# Patient Record
Sex: Male | Born: 1980 | Race: White | Hispanic: No | Marital: Single | Smoking: Former smoker
Health system: Southern US, Community
[De-identification: ages and names within clinical notes are randomized; demographics above are authoritative.]

---

## 2004-03-25 ENCOUNTER — Ambulatory Visit (HOSPITAL_COMMUNITY): Admission: RE | Admit: 2004-03-25 | Discharge: 2004-03-25 | Payer: Self-pay | Admitting: Family Medicine

## 2004-11-01 ENCOUNTER — Emergency Department (HOSPITAL_COMMUNITY): Admission: EM | Admit: 2004-11-01 | Discharge: 2004-11-01 | Payer: Self-pay | Admitting: Emergency Medicine

## 2011-11-16 ENCOUNTER — Ambulatory Visit
Admission: RE | Admit: 2011-11-16 | Discharge: 2011-11-16 | Disposition: A | Discharge status: Home / Self Care | Payer: Federal, State, Local not specified - PPO | Source: Ambulatory Visit | Attending: Family Medicine | Admitting: Family Medicine

## 2011-11-16 ENCOUNTER — Other Ambulatory Visit: Payer: Self-pay | Admitting: Family Medicine

## 2011-11-16 DIAGNOSIS — M79609 Pain in unspecified limb: Secondary | ICD-10-CM

## 2012-03-13 ENCOUNTER — Ambulatory Visit (INDEPENDENT_AMBULATORY_CARE_PROVIDER_SITE_OTHER): Payer: Federal, State, Local not specified - PPO | Admitting: Sports Medicine

## 2012-03-13 ENCOUNTER — Encounter: Payer: Self-pay | Admitting: Sports Medicine

## 2012-03-13 VITALS — BP 123/79 | HR 69 | Ht 69.0 in | Wt 170.0 lb

## 2012-03-13 DIAGNOSIS — IMO0002 Reserved for concepts with insufficient information to code with codable children: Secondary | ICD-10-CM

## 2012-03-13 DIAGNOSIS — M1711 Unilateral primary osteoarthritis, right knee: Secondary | ICD-10-CM | POA: Insufficient documentation

## 2012-03-13 DIAGNOSIS — M25569 Pain in unspecified knee: Secondary | ICD-10-CM | POA: Insufficient documentation

## 2012-03-13 DIAGNOSIS — M1710 Unilateral primary osteoarthritis, unspecified knee: Secondary | ICD-10-CM

## 2012-03-13 NOTE — Assessment & Plan Note (Signed)
This is mild and only limiting after he does too much running or activity  He can continue OTC Aleve

## 2012-03-13 NOTE — Patient Instructions (Addendum)
Try using a compression sleeve Keep on for 1 hour post exercise  Cut running to 2 to 3 days per week - softer surface better/ careful down hills  Add biking 3 days per week  Ice after exercise  Orthotics are a good idea with cushion  Recheck 6 weeks

## 2012-03-13 NOTE — Progress Notes (Signed)
  Subjective:    Patient ID: Matthew Jackson, male    DOB: Dec 15, 1980, 31 y.o.   MRN: [id number]  HPI 31 YO male with h/o previous R knee menisectomy presents as a second opinion for R knee pain. Recently saw Dr. Luiz Blare and was informed he had significant lateral compartment DJD right knee.   Has had a 6 month h/o worsening dull R knee pain that is aggravated by activity and is especially worse with going up or down stairs.  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  Describes dull pain that is mostly posterior and lateral.  Occasional anterior R knee pain.  No painful popping/catching.  No visible swelling.  Has been trying a hinged knee brace but feels it is not helping and is too bulky.  PMH:    None  PSH:   R knee menisectomy and plica excision (2003) He purposely lost about 50 pounds following high school and went from a weight of 222 to weight 170  Social:   Former smoker   HS Careers adviser     Review of Systems Gen: No F/C MSK: Per HPI Neuro: No paresthesias    Objective:   Physical Exam Gen: Alert.  NAD. MSK: Vague TTP posterior-lateral R knee.  No palpable Baker's cyst.  Some crepitance of R hamstring tendons.  No effusion.  Full ROM. 5/5 strength. Neg Clarks compression test. Ligaments are stable.  Neg  McMurrays Noted bilateral pes planus.  Calcaneus is neutral      Assessment & Plan:  1. DJD R knee. 2. Bilateral pes planus.  Recommended continuing conservative measures such as icing after activity and NSAIDs.  Provided Body Helix compression sleeve as well as soft arch supports.  Would benefit from custom orthotics.  Recommended activity modification and core exercises were also demonstrated for him.  Recommended f/u at 6 weeks.  Riki Rusk L. Katherine Roan, DO PGY 3 FM Resident

## 2012-03-13 NOTE — Assessment & Plan Note (Signed)
Begin a series of strengthening exercises  Changes workout pattern to include more, bike  Use a compression sleeve  Needs orthotics for running to correct his loss of longitudinal arch which gives him an abnormal gait with genu valgus  He is to return for these

## 2012-04-05 ENCOUNTER — Ambulatory Visit (INDEPENDENT_AMBULATORY_CARE_PROVIDER_SITE_OTHER): Payer: Federal, State, Local not specified - PPO | Admitting: Sports Medicine

## 2012-04-05 VITALS — BP 100/60 | Ht 69.0 in | Wt 170.0 lb

## 2012-04-05 DIAGNOSIS — M1710 Unilateral primary osteoarthritis, unspecified knee: Secondary | ICD-10-CM

## 2012-04-05 DIAGNOSIS — M1711 Unilateral primary osteoarthritis, right knee: Secondary | ICD-10-CM

## 2012-04-05 DIAGNOSIS — R269 Unspecified abnormalities of gait and mobility: Secondary | ICD-10-CM

## 2012-04-05 DIAGNOSIS — IMO0002 Reserved for concepts with insufficient information to code with codable children: Secondary | ICD-10-CM

## 2012-04-05 DIAGNOSIS — M25569 Pain in unspecified knee: Secondary | ICD-10-CM

## 2012-04-05 NOTE — Patient Instructions (Addendum)
Thank you for coming in today. Try running 1 or 2 days a week Cross train on a bike or swim Run on natural surface.  Come back in 6 weeks or so

## 2012-04-05 NOTE — Assessment & Plan Note (Signed)
Pronated externally rotated gait bilaterally secondary to pes planus resulting in knee pain. Fit patient with custom orthotics we'll followup in about 6 weeks for consideration for scaphoid pads in addition to the orthotic.

## 2012-04-05 NOTE — Progress Notes (Signed)
Matthew Jackson is a 31 y.o. male who presents to [facility] today for right lateral knee pain for the last 6 months. Patient has been seen by orthopedic surgeon who diagnosed him with moderate lateral DJD.   This is 10 years following a arthroscopic partial lateral meniscectomy.  He was seen back in October for this issue where a compressive knee sleeve was tried as well as scaphoid pads.  He notes that the pads are comfortable but the knee sleeve does not seem to help during running as he still has significant pain and swelling following running.  He is running about 10 miles a week on asphalt.  Denies any radiating pain weakness numbness locking or catching.  At the last visit a consideration for custom orthotics was discussed.  He is interested in this today.   Lost about 60 lbs with a running program and wants to continue.   PMH reviewed. Right arthroscopic partial lateral meniscectomy 2003 History  Substance Use Topics  . Smoking status: Former Smoker    Start date: 05/17/2007  . Smokeless tobacco: Never Used  . Alcohol Use: Not on file   ROS as above otherwise neg   Exam:  BP 100/60  Ht 5\' 9"  (1.753 m)  Wt 170 lb (77.111 kg)  BMI 25.10 kg/m2 Gen: Well NAD MSK: Right knee Normal-appearing no effusions Range of motion 0-130 with 1+ patellar crepitations Mildly tender to palpation over the lateral joint line nontender medially Negative Lachman's McMurray's valgus varus stress Normal distal vascular exam  Feet bilaterally: Significant pes planus and midfoot pronation.   Gait: Pronated gait with external rotation bilaterally.   Patient was fitted for a : standard orthotic. The orthotic was heated and afterward the patient stood on the orthotic blank positioned on the orthotic stand. The patient was positioned in subtalar neutral position and 10 degrees of ankle dorsiflexion in a weight bearing stance. After completion of molding, a stable base was applied to the orthotic  blank. The blank was ground to a stable position for weight bearing. Size: 12 (then trimmed to fit) Base: [employer] and Padding: None

## 2012-04-05 NOTE — Assessment & Plan Note (Signed)
Resulting in knee pain. Plan:  Continue compressive garment following exercise Reduce running  frequency to one or 2 days a week Crosstrainer bike or swimming other days a week Run on natural surfaces Custom orthotics as above Followup in about 6 weeks

## 2012-04-24 ENCOUNTER — Encounter: Payer: Self-pay | Admitting: Sports Medicine

## 2012-04-24 ENCOUNTER — Ambulatory Visit (INDEPENDENT_AMBULATORY_CARE_PROVIDER_SITE_OTHER): Payer: Federal, State, Local not specified - PPO | Admitting: Sports Medicine

## 2012-04-24 VITALS — BP 127/80 | HR 59 | Ht 69.0 in | Wt 170.0 lb

## 2012-04-24 DIAGNOSIS — M1710 Unilateral primary osteoarthritis, unspecified knee: Secondary | ICD-10-CM

## 2012-04-24 DIAGNOSIS — M1711 Unilateral primary osteoarthritis, right knee: Secondary | ICD-10-CM

## 2012-04-24 DIAGNOSIS — R269 Unspecified abnormalities of gait and mobility: Secondary | ICD-10-CM

## 2012-04-24 DIAGNOSIS — IMO0002 Reserved for concepts with insufficient information to code with codable children: Secondary | ICD-10-CM

## 2012-04-24 NOTE — Assessment & Plan Note (Signed)
His gait looks very neutral now that he is in custom orthotics  We modified the left orthotic to take some pressure off the arch today

## 2012-04-24 NOTE — Patient Instructions (Addendum)
Continue running taking a day between runs   Try wearing compression during running, if this is uncomfortable- ok to wear compression sleeve just after exercise  Continue wearing compression sleeve for an hour after exercise  Please follow up in 3 months  Thank you for seeing [place] today!

## 2012-04-24 NOTE — Progress Notes (Signed)
  Subjective:    Patient ID: Matthew Jackson, male    DOB: [birth year], 31 y.o.   MRN: [id number]  HPI  Pt presents to clinic for f/u of rt knee pain which he says is about 15% improved. Pain is less frequent now. Running 3 -9 miles per week.  Using bodyhelix sleeve x 1 hour after running, also wearing custom orthotics Has not noticed any swelling of knee.   Dr. Luiz Blare had diagnosed him with lateral compartment osteoarthritis of his right knee based on his standing knee films. He is about 10 years out from arthroscopic surgery of that knee.   Review of Systems     Objective:   Physical Exam  No acute distress  RT Knee: Normal to inspection with no erythema or effusion or obvious bony abnormalities. Palpation normal with no warmth or joint line tenderness or patellar tenderness or condyle tenderness. ROM normal in flexion and extension and lower leg rotation. Ligaments with solid consistent endpoints including ACL, PCL, LCL, MCL. Negative Mcmurray's and provocative meniscal tests. Non painful patellar compression. Patellar and quadriceps tendons unremarkable. Hamstring and quadriceps strength is normal.  MSK ultrasound - RT knee I did not find any significant pathology on the knee except in the lateral posterior half of the joint line there is narrowing and loss of meniscus There is very mild spurring in this area There is no effusion and quadriceps and patellar tendons are normal. Medial meniscus looks normal.       Assessment & Plan:

## 2012-04-24 NOTE — Assessment & Plan Note (Signed)
This is mild and clinically does not limit patient much at this time  I advised that he should moderate his running to every other day and on the opposite day do stationary biking  He is compression sleeve either during exercise or afterwards which ever feels most comfortable  Continue to use orthotics for support  Recheck in 3 months

## 2012-06-30 ENCOUNTER — Other Ambulatory Visit: Payer: Self-pay

## 2012-10-02 ENCOUNTER — Encounter: Payer: Self-pay | Admitting: Sports Medicine

## 2012-10-02 ENCOUNTER — Ambulatory Visit (INDEPENDENT_AMBULATORY_CARE_PROVIDER_SITE_OTHER): Payer: Federal, State, Local not specified - PPO | Admitting: Sports Medicine

## 2012-10-02 VITALS — BP 113/72 | HR 64 | Ht 69.0 in | Wt 170.0 lb

## 2012-10-02 DIAGNOSIS — M1710 Unilateral primary osteoarthritis, unspecified knee: Secondary | ICD-10-CM

## 2012-10-02 DIAGNOSIS — M25569 Pain in unspecified knee: Secondary | ICD-10-CM

## 2012-10-02 DIAGNOSIS — M1711 Unilateral primary osteoarthritis, right knee: Secondary | ICD-10-CM

## 2012-10-02 DIAGNOSIS — IMO0002 Reserved for concepts with insufficient information to code with codable children: Secondary | ICD-10-CM

## 2012-10-02 DIAGNOSIS — M25561 Pain in right knee: Secondary | ICD-10-CM

## 2012-10-02 NOTE — Assessment & Plan Note (Signed)
Pain seems essentially resolved  Would keep using compression to lessen risk of swelling  Prn NSAIDs  Reck with me in 1 year or prn

## 2012-10-02 NOTE — Progress Notes (Signed)
  Subjective:    Patient ID: Matthew Jackson, male    DOB: March 22, 1981, 32 y.o.   MRN: [id number]  HPI  Pt presents to clinic for f/u of rt knee pain which he states is improved. He is run/walking 3 miles 3-4 days per week without problems. Takes aleve before running, and uses compression sleeve consistently.   We place him in custom orthotics which have helped reduce pain and in fact no pain on runs since last visit  Cross train - wants to use bike  Known mild DJD diagnosed by Dr Luiz Blare more in lat compt on RT knee   Review of Systems     Objective:   Physical Exam NAD  Knee: Normal to inspection with no erythema or effusion or obvious bony abnormalities. Palpation normal with no warmth or joint line tenderness or patellar tenderness or condyle tenderness. ROM normal in flexion and extension and lower leg rotation. Ligaments with solid consistent endpoints including ACL, PCL, LCL, MCL. Negative Mcmurray's and provocative meniscal tests. Non painful patellar compression. Patellar and quadriceps tendons unremarkable. Hamstring and quadriceps strength is normal.  Excellent quad and hip strength        Assessment & Plan:

## 2012-10-02 NOTE — Assessment & Plan Note (Signed)
This seems to be tolerating the exercise program we outlined with no worsening of sxs  We will continue this with limits  See pat insturctions

## 2012-10-02 NOTE — Patient Instructions (Addendum)
Find a good stationary bike Either semi-recumbent or you might try a [employer] Air-dyne Key is does the cycle stroke feel comfortable and not jerky  Running OK up to every other day  Compression is a good idea for the long term  Orthotics probably good for up to 3 years  Shoes - go for high cushion not stability but they have to feel good with orthotics in place  Then see me once a year or so or as needed

## 2013-02-11 ENCOUNTER — Ambulatory Visit (INDEPENDENT_AMBULATORY_CARE_PROVIDER_SITE_OTHER): Payer: Federal, State, Local not specified - PPO | Admitting: Family Medicine

## 2013-02-11 VITALS — BP 136/80 | Ht 70.0 in | Wt 173.0 lb

## 2013-02-11 DIAGNOSIS — M1711 Unilateral primary osteoarthritis, right knee: Secondary | ICD-10-CM

## 2013-02-11 DIAGNOSIS — M1710 Unilateral primary osteoarthritis, unspecified knee: Secondary | ICD-10-CM

## 2013-02-11 DIAGNOSIS — IMO0002 Reserved for concepts with insufficient information to code with codable children: Secondary | ICD-10-CM

## 2013-02-11 MED ORDER — METHYLPREDNISOLONE ACETATE 40 MG/ML IJ SUSP
40.0000 mg | Freq: Once | INTRAMUSCULAR | Status: AC
  Administered 2013-02-11: 40 mg via INTRA_ARTICULAR

## 2013-02-15 NOTE — Progress Notes (Signed)
  Subjective:    Patient ID: Matthew Jackson, male    DOB: 03/04/1981, 32 y.o.   MRN: [id number]  HPI Right knee pain worsening over the last couple of months. He has known osteoporosis bilateral knees a couple of years ago had to significantly alter his daily running and decrease his mileage. He's done really well with the change to every other day running in decreased distance. He runs about 3 miles a day every other day. 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 He thinks this is what has gotten it flared up. Right knee is slow but swollen and feels like it might lock on him but is not. He's not noticed any warmth or redness.   Review of Systems Denies fever, sweats, chills.    Objective:   Physical Exam Vital signs are reviewed GENERAL: Well-developed male no acute distress KNEES: Bilaterally the knees are symmetrical without effusion. The right knee has tenderness at the medial joint line. 4 range of motion in flexion extension. Ligamentously intact to varus and valgus stress. ULTRASOUND: Very small amount of fluid seen in the suprapatellar pouch. Patellar quadricep tendon are intact and normal. Medial meniscus is normal in appearance. Lateral meniscus is ever so slightly protrusive.  INJECTION: Patient was given informed consent, signed copy in the chart. Appropriate time out was taken. Area prepped and draped in usual sterile fashion. One cc of methylprednisolone 40 mg/ml plus  4 cc of 1% lidocaine without epinephrine was injected into the right knee using a(n) anterior medial approach. The patient tolerated the procedure well. There were no complications. Post procedure instructions were given.        Assessment & Plan:  Greater than 50% of our 45 minute office visit was spent in counseling and education about osteoarthritis, treatment options, activity patterns.

## 2013-02-15 NOTE — Assessment & Plan Note (Signed)
Today we discussed options including injection and topical might lessen therapy. He opted for injection. If he does not get enough improvement out of this I would not hesitate to add Topamax glycerin patch. We'll see how he does. Followup when necessary to

## 2013-03-21 ENCOUNTER — Other Ambulatory Visit: Payer: Self-pay

## 2014-02-05 ENCOUNTER — Ambulatory Visit: Payer: Federal, State, Local not specified - PPO | Admitting: Sports Medicine

## 2014-02-11 ENCOUNTER — Ambulatory Visit (INDEPENDENT_AMBULATORY_CARE_PROVIDER_SITE_OTHER): Payer: Federal, State, Local not specified - PPO | Admitting: Sports Medicine

## 2014-02-11 ENCOUNTER — Encounter: Payer: Self-pay | Admitting: Sports Medicine

## 2014-02-11 VITALS — BP 138/80 | HR 60 | Ht 69.0 in | Wt 170.0 lb

## 2014-02-11 DIAGNOSIS — M1731 Unilateral post-traumatic osteoarthritis, right knee: Secondary | ICD-10-CM

## 2014-02-11 DIAGNOSIS — M175 Other unilateral secondary osteoarthritis of knee: Secondary | ICD-10-CM

## 2014-02-11 NOTE — Progress Notes (Signed)
Patient ID: Matthew GrainRobert M Eshleman, male   DOB: 01/31/1981, 33 y.o.   MRN: [id number]  Patient returns for followup of his knee pain. His creatinine had a partial meniscectomy in 2003. Saw him in 2013 he had some mild arthritic change. Crosstraining and strength work has allowed him to stay pretty active. Dr Jennette KettleNeal  injected his knee about one year ago and I gave in 4-6 weeks of some relief of pain. Currently he uses naproxen 500 mg which helps the pain. He is not seeing swelling or having mechanical symptoms. He runs on Monday Wednesday and Friday. He bikes on Tuesday and Thursday. 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  Physical exam No acute distress BP 138/80  Pulse 60  Ht 5\' 9"  (1.753 m)  Wt 170 lb (77.111 kg)  BMI 25.09 kg/m2  Knee: Normal to inspection with no erythema or effusion or obvious bony abnormalities. Palpation normal with no warmth or joint line tenderness or patellar tenderness or condyle tenderness. ROM normal in flexion and extension and lower leg rotation. Ligaments with solid consistent endpoints including ACL, PCL, LCL, MCL. Negative Mcmurray's and provocative meniscal tests. Non painful patellar compression. Patellar and quadriceps tendons unremarkable. Hamstring and quadriceps strength is normal.  Hip abduction strength is excellent  Ultrasound Effusion in the suprapatellar pouch There is some mild swelling along the medial joint line in a specific area near the midline with mild loss of meniscus Lateral joint line shows some narrowing of the joint space and very mild arthritic change/ loss of meniscal volume Patellar and quad tendons are normal.

## 2014-02-11 NOTE — Assessment & Plan Note (Signed)
I think his arthritis has stayed fairly stable Functionally he is able to do a lot I would not increase running or weight bearing activity Modified high-dose his strength exercises to lessen the degree of knee bend  He should go back to using compression more when he gets some swelling  We will continue to monitor this and he will return in one year or when necessary if he gets more pain

## 2014-02-28 ENCOUNTER — Other Ambulatory Visit: Payer: Self-pay

## 2014-05-05 ENCOUNTER — Encounter: Payer: Federal, State, Local not specified - PPO | Admitting: Sports Medicine

## 2014-05-15 ENCOUNTER — Encounter: Payer: Self-pay | Admitting: Podiatry

## 2014-05-15 ENCOUNTER — Ambulatory Visit (INDEPENDENT_AMBULATORY_CARE_PROVIDER_SITE_OTHER): Payer: Federal, State, Local not specified - PPO | Admitting: Podiatry

## 2014-05-15 VITALS — BP 115/67 | HR 69 | Resp 16 | Ht 70.0 in | Wt 180.0 lb

## 2014-05-15 DIAGNOSIS — L600 Ingrowing nail: Secondary | ICD-10-CM

## 2014-05-15 NOTE — Progress Notes (Signed)
Subjective:     Patient ID: Matthew GrainRobert M Diemer, male   DOB: 10/23/1980, 33 y.o.   MRN: [id number]  HPI patient presents with a painful ingrown toenail left big toe lateral border that he states is been present for about a month and has not responded to trimming soaking and oral antibiotics which helped to reduce the redness but not the pain   Review of Systems  All other systems reviewed and are negative.      Objective:   Physical Exam  Constitutional: He is oriented to person, place, and time.  Cardiovascular: Intact distal pulses.   Musculoskeletal: Normal range of motion.  Neurological: He is oriented to person, place, and time.  Skin: Skin is warm.  Nursing note and vitals reviewed.  neurovascular found to be intact muscle strength adequate with range of motion subtalar and midtarsal joint within normal limits. patient is noted to have an incurvated left hallux lateral border that's very painful when pressed with redness and slight drainage in the distal portion that's localized in its nature patient is noted to have good digital perfusion is well oriented 3     Assessment:     Ingrown toenail with previous paronychia infection left hallux that is painful when pressed with obvious incurvation of the lateral corner    Plan:     H&P and condition reviewed with patient. I have recommended correction and reviewed risk and procedure. Patient wants procedure and today I infiltrated 60 mg Xylocaine Marcaine mixture remove the lateral border exposed matrix and applied phenol 3 applications 30 seconds followed by alcohol lavaged and sterile dressing. They've instructions on soaks and reappoint

## 2014-05-15 NOTE — Progress Notes (Signed)
   Subjective:    Patient ID: Matthew Jackson, male    DOB: 12/04/1980, 33 y.o.   MRN: [id number]  HPI Comments: i have an ingrown toenail on my left big toe. It is painful. Ive had it for 1 month. Its getting better. i have used otc dr Idelle Josholls ingrown relief and it became infected. My doctor gave me an antibiotic and i have been off of amoxicillin and clavulante for 2 weeks.      Review of Systems  All other systems reviewed and are negative.      Objective:   Physical Exam        Assessment & Plan:

## 2014-05-15 NOTE — Patient Instructions (Signed)

## 2014-06-12 ENCOUNTER — Encounter: Payer: Self-pay | Admitting: Sports Medicine

## 2014-06-12 ENCOUNTER — Ambulatory Visit (INDEPENDENT_AMBULATORY_CARE_PROVIDER_SITE_OTHER): Payer: Federal, State, Local not specified - PPO | Admitting: Sports Medicine

## 2014-06-12 VITALS — BP 109/60 | Ht 69.0 in | Wt 180.0 lb

## 2014-06-12 DIAGNOSIS — M1731 Unilateral post-traumatic osteoarthritis, right knee: Secondary | ICD-10-CM

## 2014-06-12 DIAGNOSIS — R269 Unspecified abnormalities of gait and mobility: Secondary | ICD-10-CM

## 2014-06-12 NOTE — Progress Notes (Signed)
  Matthew GrainRobert M Santee - 34 y.o. male MRN [id number]  Date of birth: 02/16/1981  SUBJECTIVE: CC: Right knee pain and abnormal gait HPI: Long-standing history of right knee pain following a degenerative meniscal tear in his early 5520s. Status post debridement and has subsequently developed arthritis. He is active running 3 days per week and cycling the other 2 days per week. He has been in custom orthotics for the past 2 years and reports overall doing well but has daily discomfort with his right knee. e does feel that being active while wearing orthotics is much more comfortable than when he does not have them on. Denies any lower extremity mechanical symptoms  ROS: otherwise per HPI.   HISTORY:  Past Medical, Surgical, Social, and Family History reviewed & updated per EMR.  Pertinent Historical Findings include:  reports that he has quit smoking. He started smoking about 7 years ago. He has never used smokeless tobacco. Otherwise relatively healthy. He is active as above. Prior right knee arthroscopic meniscal debridement Recent. Medial infection with ingrown toenail status post removal in December 2015.   OBJECTIVE:  VS:   HT:5\' 9"  (175.3 cm)   WT:180 lb (81.647 kg)  BMI:26.6          BP:109/60 mmHg  HR: bpm  TEMP: ( )  RESP:   PHYSICAL EXAM:  Adult Caucasian male no acute distress he's alert and appropriately interactive. Lower Extremity exam: Bilateral knee is well aligned. No significant patellar grind. Right hip abduction strength 5-/5 with TFL predominant. Left 5/5 glute medius.   ASSESSMENT: 1. Post-traumatic osteoarthritis of right knee   2. Gait abnormality    PROCEDURES: CUSTOM ORTHOTICS The patient was fitted for a standard, cushioned, semi-rigid orthotic. The orthotic was heated & placed on the orthotic stand. The patient was positioned in subtalar neutral position and 10 of ankle dorsiflexion and weight bearing stance some heated orthotic blank. After completion of the  molding a stable paste was applied to the orthotic blank. The orthotic was ground to a stable position for weightbearing. The patient ambulated in these and reported they were comfortable without pressure spots.              BLANK:  Size 11 - Standard Cushioned                 BASE:  Blue EVA      POSTINGS:  none >50% of this 45 minute visit was spent in direct face to face evaluation, measurement and manufacture of custom molded orthotic.   PLAN: See problem based charting & AVS for additional documentation.  Custom orthotics today  HEP: Glute medius strengthening, quad sets, 30 1 leg at squats. > Return if symptoms worsen or fail to improve.

## 2015-03-24 ENCOUNTER — Other Ambulatory Visit: Payer: Self-pay | Admitting: Family Medicine

## 2015-03-24 ENCOUNTER — Ambulatory Visit
Admission: RE | Admit: 2015-03-24 | Discharge: 2015-03-24 | Disposition: A | Discharge status: Home / Self Care | Payer: Federal, State, Local not specified - PPO | Source: Ambulatory Visit | Attending: Family Medicine | Admitting: Family Medicine

## 2015-03-24 DIAGNOSIS — R229 Localized swelling, mass and lump, unspecified: Secondary | ICD-10-CM

## 2015-05-26 ENCOUNTER — Ambulatory Visit: Payer: Federal, State, Local not specified - PPO | Admitting: Family Medicine

## 2015-06-25 ENCOUNTER — Ambulatory Visit (INDEPENDENT_AMBULATORY_CARE_PROVIDER_SITE_OTHER): Payer: Federal, State, Local not specified - PPO | Admitting: Podiatry

## 2015-06-25 ENCOUNTER — Encounter: Payer: Self-pay | Admitting: Podiatry

## 2015-06-25 VITALS — BP 121/75 | HR 64 | Resp 12

## 2015-06-25 DIAGNOSIS — B351 Tinea unguium: Secondary | ICD-10-CM | POA: Diagnosis not present

## 2015-06-25 MED ORDER — TERBINAFINE HCL 250 MG PO TABS: ORAL_TABLET | ORAL | Status: DC

## 2015-06-25 NOTE — Progress Notes (Signed)
   Subjective:    Patient ID: Matthew Jackson, male    DOB: 1981-01-19, 35 y.o.   MRN: [id number]  HPI  RT FOOT 1ST,2ND TOENAIL HAVE DISCOLORATION FOR 6 MONTHS. TOENAILS ARE THE SAME BUT NOT WORSE. TRIED NO TREATMENT.  Review of Systems  Skin: Positive for color change.       Objective:   Physical Exam        Assessment & Plan:

## 2015-06-25 NOTE — Progress Notes (Signed)
Subjective:     Patient ID: Matthew Jackson, male   DOB: 1980-07-31, 35 y.o.   MRN: [id number]  HPI patient presents with 4 nails that have yellow discoloration on the right foot with thickness of the right second nail   Review of Systems     Objective:   Physical Exam Neurovascular status intact muscle strength adequate with discoloration of the hallux thickness of the right second nail discoloration fourth and fifth nails right    Assessment:     Combination of trauma and mycotic component with significant family history    Plan:     Placed on Lamisil pills pack and begin aggressive trimming and laser in the next 2 weeks

## 2015-07-03 DIAGNOSIS — B351 Tinea unguium: Secondary | ICD-10-CM

## 2015-07-10 ENCOUNTER — Ambulatory Visit (INDEPENDENT_AMBULATORY_CARE_PROVIDER_SITE_OTHER): Payer: Federal, State, Local not specified - PPO | Admitting: Podiatry

## 2015-07-10 DIAGNOSIS — B351 Tinea unguium: Secondary | ICD-10-CM

## 2015-07-13 NOTE — Progress Notes (Signed)
Subjective:     Patient ID: Matthew Jackson, male   DOB: Apr 09, 1981, 35 y.o.   MRN: [id number]  HPI patient presents with nail disease   Review of Systems     Objective:   Physical Exam Neurovascular status intact with yellow nail disease bilateral    Assessment:     Mycotic nail infection    Plan:     Debridement of nails accomplished with laser treatment administered today and reappoint one month for repeat of this procedure

## 2015-08-31 DIAGNOSIS — F909 Attention-deficit hyperactivity disorder, unspecified type: Secondary | ICD-10-CM | POA: Diagnosis not present

## 2015-09-21 DIAGNOSIS — K080 Exfoliation of teeth due to systemic causes: Secondary | ICD-10-CM | POA: Diagnosis not present

## 2015-10-20 DIAGNOSIS — Z113 Encounter for screening for infections with a predominantly sexual mode of transmission: Secondary | ICD-10-CM | POA: Diagnosis not present

## 2015-10-20 DIAGNOSIS — Z13220 Encounter for screening for lipoid disorders: Secondary | ICD-10-CM | POA: Diagnosis not present

## 2015-10-20 DIAGNOSIS — Z0000 Encounter for general adult medical examination without abnormal findings: Secondary | ICD-10-CM | POA: Diagnosis not present

## 2015-11-02 DIAGNOSIS — F909 Attention-deficit hyperactivity disorder, unspecified type: Secondary | ICD-10-CM | POA: Diagnosis not present

## 2015-12-28 DIAGNOSIS — F909 Attention-deficit hyperactivity disorder, unspecified type: Secondary | ICD-10-CM | POA: Diagnosis not present

## 2016-01-24 DIAGNOSIS — S63637A Sprain of interphalangeal joint of left little finger, initial encounter: Secondary | ICD-10-CM | POA: Diagnosis not present

## 2016-01-24 DIAGNOSIS — S39012A Strain of muscle, fascia and tendon of lower back, initial encounter: Secondary | ICD-10-CM | POA: Diagnosis not present

## 2016-01-24 DIAGNOSIS — R252 Cramp and spasm: Secondary | ICD-10-CM | POA: Diagnosis not present

## 2016-02-04 DIAGNOSIS — S6992XD Unspecified injury of left wrist, hand and finger(s), subsequent encounter: Secondary | ICD-10-CM | POA: Diagnosis not present

## 2016-02-29 ENCOUNTER — Other Ambulatory Visit: Payer: Federal, State, Local not specified - PPO

## 2016-02-29 ENCOUNTER — Other Ambulatory Visit: Payer: Self-pay | Admitting: Family Medicine

## 2016-02-29 DIAGNOSIS — F909 Attention-deficit hyperactivity disorder, unspecified type: Secondary | ICD-10-CM | POA: Diagnosis not present

## 2016-02-29 DIAGNOSIS — S6992XD Unspecified injury of left wrist, hand and finger(s), subsequent encounter: Secondary | ICD-10-CM | POA: Diagnosis not present

## 2016-03-02 ENCOUNTER — Ambulatory Visit
Admission: RE | Admit: 2016-03-02 | Discharge: 2016-03-02 | Disposition: A | Discharge status: Home / Self Care | Payer: Federal, State, Local not specified - PPO | Source: Ambulatory Visit | Attending: Family Medicine | Admitting: Family Medicine

## 2016-03-02 DIAGNOSIS — M79645 Pain in left finger(s): Secondary | ICD-10-CM | POA: Diagnosis not present

## 2016-03-02 DIAGNOSIS — S6992XD Unspecified injury of left wrist, hand and finger(s), subsequent encounter: Secondary | ICD-10-CM

## 2016-03-02 DIAGNOSIS — S6992XA Unspecified injury of left wrist, hand and finger(s), initial encounter: Secondary | ICD-10-CM | POA: Diagnosis not present

## 2016-03-02 DIAGNOSIS — Z23 Encounter for immunization: Secondary | ICD-10-CM | POA: Diagnosis not present

## 2016-03-17 ENCOUNTER — Other Ambulatory Visit: Payer: Self-pay | Admitting: *Deleted

## 2016-03-17 ENCOUNTER — Ambulatory Visit (INDEPENDENT_AMBULATORY_CARE_PROVIDER_SITE_OTHER): Payer: Federal, State, Local not specified - PPO | Admitting: Sports Medicine

## 2016-03-17 ENCOUNTER — Encounter: Payer: Self-pay | Admitting: Sports Medicine

## 2016-03-17 DIAGNOSIS — M1731 Unilateral post-traumatic osteoarthritis, right knee: Secondary | ICD-10-CM

## 2016-03-17 DIAGNOSIS — R269 Unspecified abnormalities of gait and mobility: Secondary | ICD-10-CM

## 2016-03-17 NOTE — Assessment & Plan Note (Signed)
   Orthotics made today Continue using custom orthotics to support his longitudinal arch  These have lessened his lower extremity pain Replace these when this pair wears

## 2016-03-17 NOTE — Assessment & Plan Note (Signed)
  Stable without worsening symptoms as long as he uses orthotics  Okay to keep up limited running but alternate with biking

## 2016-03-17 NOTE — Progress Notes (Signed)
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 The orthotic was heated and afterward the patient stood on the orthotic blank positioned on the orthotic stand. The patient was positioned in subtalar neutral position and 10 degrees of ankle dorsiflexion in a weight bearing stance. After completion of molding, a stable base was applied to the orthotic blank. The blank was ground to a stable position for weight bearing. Size: 11 Red EVA Base: blue med. EVA Posting:  none Additional orthotic padding: none  After preparation of the orthotic we are able to get the patient to neutral gait with walking and with jogging He had good comfort with these in place

## 2016-04-14 DIAGNOSIS — S63637A Sprain of interphalangeal joint of left little finger, initial encounter: Secondary | ICD-10-CM | POA: Diagnosis not present

## 2016-05-02 DIAGNOSIS — K080 Exfoliation of teeth due to systemic causes: Secondary | ICD-10-CM | POA: Diagnosis not present

## 2016-05-02 DIAGNOSIS — S63637A Sprain of interphalangeal joint of left little finger, initial encounter: Secondary | ICD-10-CM | POA: Diagnosis not present

## 2016-05-23 DIAGNOSIS — F909 Attention-deficit hyperactivity disorder, unspecified type: Secondary | ICD-10-CM | POA: Diagnosis not present

## 2016-05-31 DIAGNOSIS — S63637D Sprain of interphalangeal joint of left little finger, subsequent encounter: Secondary | ICD-10-CM | POA: Diagnosis not present

## 2016-08-15 DIAGNOSIS — F909 Attention-deficit hyperactivity disorder, unspecified type: Secondary | ICD-10-CM | POA: Diagnosis not present

## 2016-10-27 DIAGNOSIS — K080 Exfoliation of teeth due to systemic causes: Secondary | ICD-10-CM | POA: Diagnosis not present

## 2016-11-01 DIAGNOSIS — K080 Exfoliation of teeth due to systemic causes: Secondary | ICD-10-CM | POA: Diagnosis not present

## 2016-11-07 DIAGNOSIS — F909 Attention-deficit hyperactivity disorder, unspecified type: Secondary | ICD-10-CM | POA: Diagnosis not present

## 2016-11-10 IMAGING — CR DG FINGER LITTLE 2+V*L*
4 series · 4 of 4 positions shown · non-contrast
Comparison: None.

CLINICAL DATA: Football injury to fifth digit 1 month ago with
persistent PIP joint pain, initial encounter

EXAM:
LEFT LITTLE FINGER 2+V

[x finger pa left]
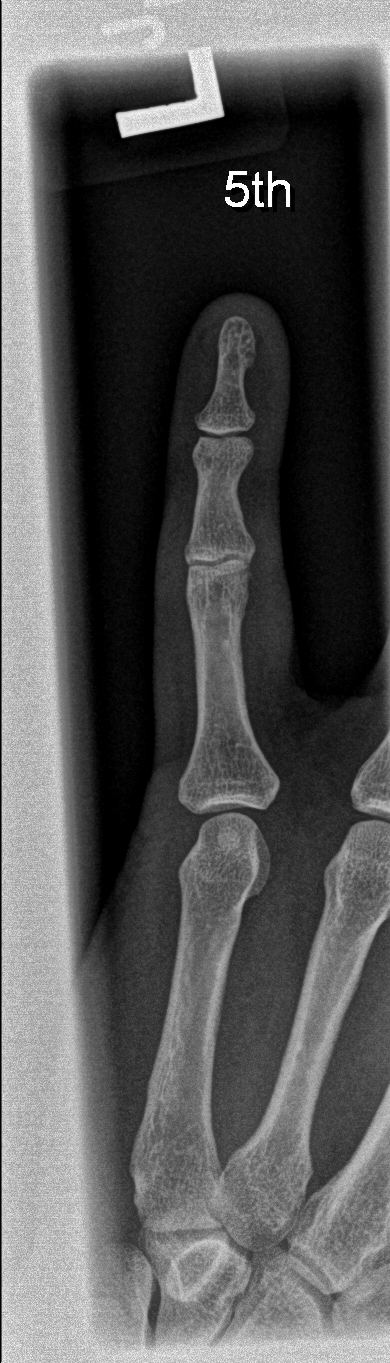

[x finger obl left]
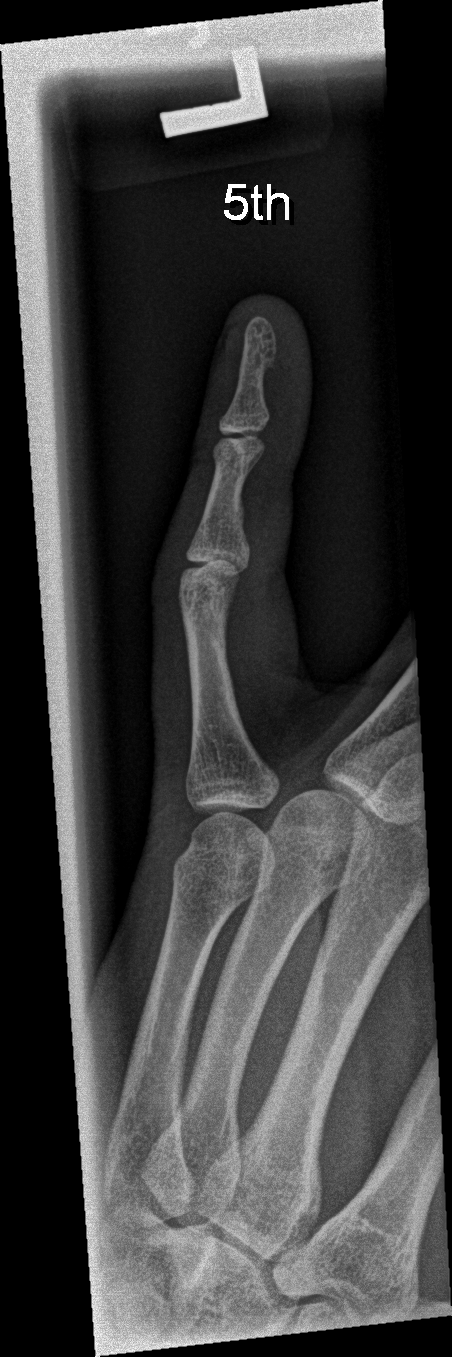

[x finger lat left (1 of 2)]
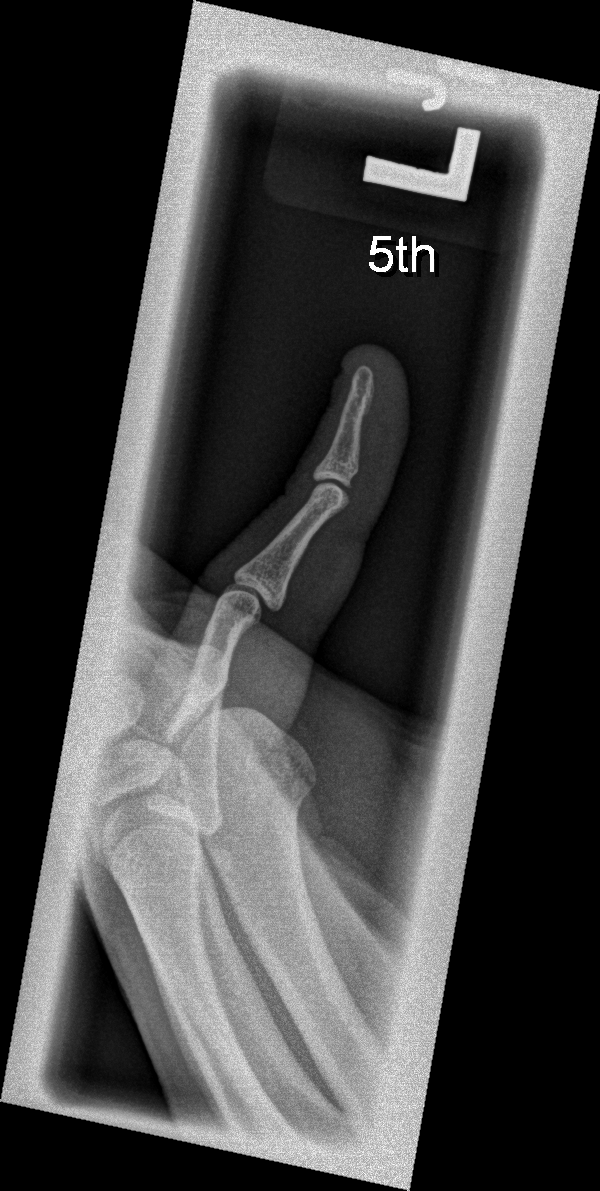

[x finger lat left (2 of 2)]
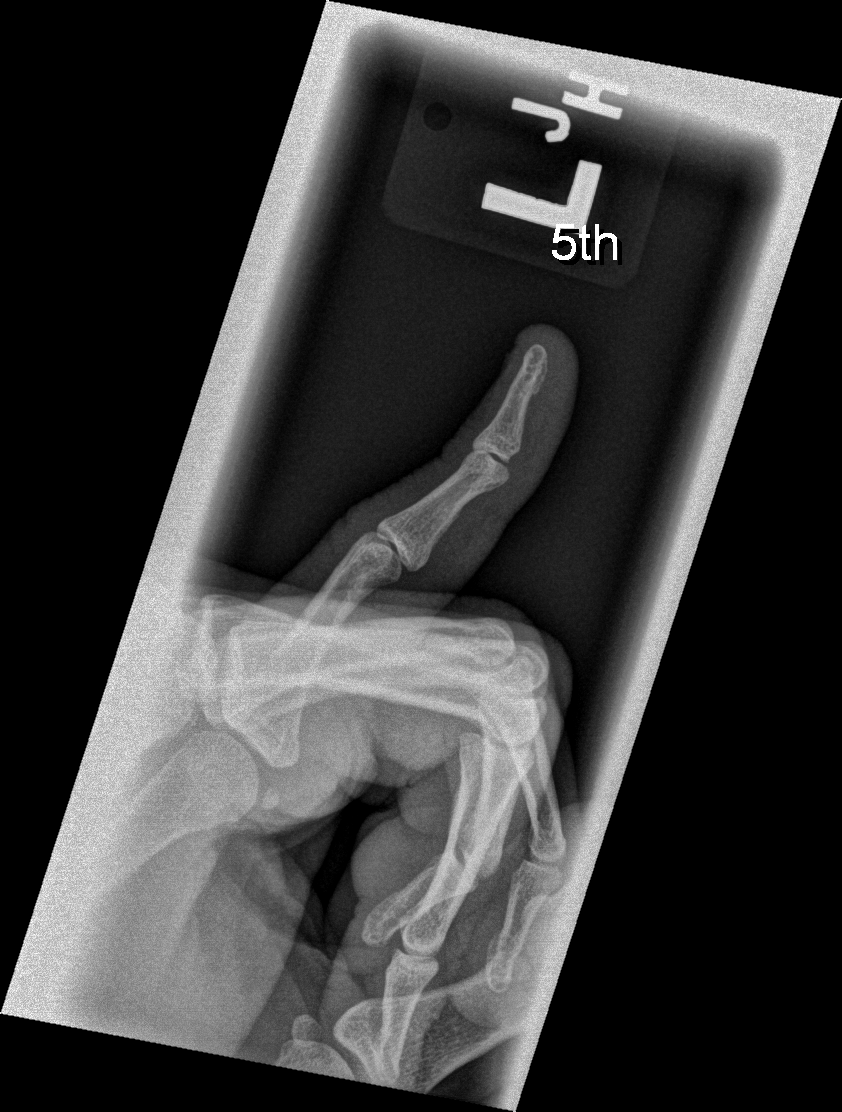

[4 of 4 positions shown; findings below may reference images not displayed]

FINDINGS: There is no evidence of fracture or dislocation. There is no
evidence of arthropathy or other focal bone abnormality. Soft
tissues are unremarkable.
IMPRESSION: No acute abnormality noted.

## 2016-12-01 DIAGNOSIS — Z113 Encounter for screening for infections with a predominantly sexual mode of transmission: Secondary | ICD-10-CM | POA: Diagnosis not present

## 2016-12-01 DIAGNOSIS — Z13220 Encounter for screening for lipoid disorders: Secondary | ICD-10-CM | POA: Diagnosis not present

## 2016-12-01 DIAGNOSIS — Z0000 Encounter for general adult medical examination without abnormal findings: Secondary | ICD-10-CM | POA: Diagnosis not present

## 2016-12-12 DIAGNOSIS — K080 Exfoliation of teeth due to systemic causes: Secondary | ICD-10-CM | POA: Diagnosis not present

## 2017-01-30 DIAGNOSIS — F909 Attention-deficit hyperactivity disorder, unspecified type: Secondary | ICD-10-CM | POA: Diagnosis not present

## 2017-04-26 DIAGNOSIS — F909 Attention-deficit hyperactivity disorder, unspecified type: Secondary | ICD-10-CM | POA: Diagnosis not present

## 2017-06-02 DIAGNOSIS — J029 Acute pharyngitis, unspecified: Secondary | ICD-10-CM | POA: Diagnosis not present

## 2017-07-07 ENCOUNTER — Encounter: Payer: Self-pay | Admitting: Family Medicine

## 2017-07-07 ENCOUNTER — Ambulatory Visit: Payer: Federal, State, Local not specified - PPO | Admitting: Family Medicine

## 2017-07-07 DIAGNOSIS — S83207A Unspecified tear of unspecified meniscus, current injury, left knee, initial encounter: Secondary | ICD-10-CM | POA: Insufficient documentation

## 2017-07-07 DIAGNOSIS — S83282A Other tear of lateral meniscus, current injury, left knee, initial encounter: Secondary | ICD-10-CM | POA: Diagnosis not present

## 2017-07-07 NOTE — Progress Notes (Signed)
[facility]: Attending Note: I have reviewed the chart, discussed wit the Sports Medicine Fellow. I agree with assessment and treatment plan as detailed in the Fellow's note.  

## 2017-07-07 NOTE — Progress Notes (Signed)
   HPI  CC: Left knee pain Patient is here to discuss his new onset left-sided knee pain.  He states that pain began at the end of January.  It has persisted since that time.  He denies any injury, trauma, or event which may have caused this.  He states that he is active with running and biking.  Pain has been severe enough to which he has had to stop and rest when going down a flight of stairs.  Pain is described as sharp and located along the lateral aspect of his knee.  He denies any swelling.  No mechanical locking, clicking, or catching.  No prior knee injury to this knee.  Patient does have a history of a right sided meniscal tear status post partial meniscectomy with associated posttraumatic osteoarthritic progression.  Medications/Interventions Tried: Relative rest, ice, compression.  See HPI and/or previous note for associated ROS.  Objective: BP 118/82   Ht 5\' 10"  (1.778 m)   Wt 180 lb (81.6 kg)   BMI 25.83 kg/m  Gen: NAD, well groomed, a/o x3, normal affect.  CV: Well-perfused. Warm.  Resp: Non-labored.  Neuro: Sensation intact throughout. No gross coordination deficits.  Gait: Nonpathologic posture, unremarkable stride without signs of limp or balance issues. Knee, Left: TTP noted relatively vaguely at the lateral joint line. Inspection was negative for erythema, ecchymosis, and effusion. No obvious bony abnormalities or signs of osteophyte development. Palpation yielded no asymmetric warmth; No condyle tenderness; No patellar tenderness; No patellar crepitus. Patellar and quadriceps tendons unremarkable, and no tenderness of the pes anserine bursa. No obvious Baker's cyst development. ROM normal in flexion (135 degrees) and extension (0 degrees). Normal hamstring and quadriceps strength. Neurovascularly intact bilaterally.  - Ligaments: (Solid and consistent endpoints)   - ACL (present bilaterally)   - PCL (present bilaterally)   - LCL (present bilaterally)   - MCL (present  bilaterally).   - Additional tests performed:    - Anterior Drawer >> NEG   - Lachman >> NEG  - Meniscus:   - Thessaly: Positive laterally   - McMurray's: Positive laterally  - Patella:   - Patellar grind/compression: NEG   - Patellar glide: Without apprehension   Assessment and plan:  Tear of meniscus of left knee Patient is here with signs and symptoms consistent with a lateral meniscus tear.  Patient denies any swelling, locking, clicking, or catching.  Symptoms had not been present prior to January.  He denies any specific event/injury.  Decision was made to treat conservatively for now which I feels appropriate due to his lack of significant or mechanical symptoms. -We had a long discussion about the decision to treat conservatively versus with surgery. -Quadricep and hip abductor strengthening exercises provided today. -Encouraged to ice regularly especially immediately after exercise. -Encourage compression sleeve. -Avoidance of high risk activities with pivoting and cutting. -OTC NSAIDs as needed -Follow-up 4-6 weeks >> if no improvement then could consider MRI if patient desires surgery.   Kathee DeltonIan D Kmarion Rawl, MD,MS [facility] Fellow 07/07/2017 12:56 PM

## 2017-07-07 NOTE — Addendum Note (Signed)
Addended by: Rutha BouchardBABNIK, Shalanda Brogden E on: 07/07/2017 01:49 PM   Modules accepted: Orders

## 2017-07-07 NOTE — Assessment & Plan Note (Signed)
Patient is here with signs and symptoms consistent with a lateral meniscus tear.  Patient denies any swelling, locking, clicking, or catching.  Symptoms had not been present prior to January.  He denies any specific event/injury.  Decision was made to treat conservatively for now which I feels appropriate due to his lack of significant or mechanical symptoms. -We had a long discussion about the decision to treat conservatively versus with surgery. -Quadricep and hip abductor strengthening exercises provided today. -Encouraged to ice regularly especially immediately after exercise. -Encourage compression sleeve. -Avoidance of high risk activities with pivoting and cutting. -OTC NSAIDs as needed -Follow-up 4-6 weeks >> if no improvement then could consider MRI if patient desires surgery.

## 2017-07-24 DIAGNOSIS — F909 Attention-deficit hyperactivity disorder, unspecified type: Secondary | ICD-10-CM | POA: Diagnosis not present

## 2017-08-15 ENCOUNTER — Encounter: Payer: Self-pay | Admitting: Sports Medicine

## 2017-08-15 ENCOUNTER — Ambulatory Visit: Payer: Federal, State, Local not specified - PPO | Admitting: Sports Medicine

## 2017-08-15 VITALS — BP 130/78 | Ht 70.0 in | Wt 175.0 lb

## 2017-08-15 DIAGNOSIS — S83282A Other tear of lateral meniscus, current injury, left knee, initial encounter: Secondary | ICD-10-CM

## 2017-08-15 NOTE — Progress Notes (Signed)
   Subjective:    Patient ID: Matthew Jackson, male    DOB: 01/03/1981, 37 y.o.   MRN: [id number]   CC: Follow up for left knee pain  HPI: Patient is a 37 yo male who presents today for left knee pain follow up. Patient was last seen in clinic on 07/07/2017 for left knee pain with a suspected diagnosis of left meniscus tear. Patient at the time was still experiencing some pain in his knee with activity and walking. Patient decided to treat pain with conservative measures. Patient reports that he has been doing quad and hip abductor strengthening exercise and using a compression sleeves. Patient reports that his knee pain gradually improved. Two weeks ago patient was on a trip and walk 6 miles daily without any pain. Since then he has resumed running ( 2 times in the past 2 weeks) and recumbent bike without any pain in his knee. He currently denies any pain, locking, clicking, swelling or warmth in his left knee.  Smoking status reviewed   ROS: all other systems were reviewed and are negative other than in the HPI  Knee ROS - no locking, no giving way, no swelling  No past medical history on file.  No past surgical history on file.  Past medical history, surgical, family, and social history reviewed and updated in the EMR as appropriate.  Objective:  BP 130/78   Ht 5\' 10"  (1.778 m)   Wt 175 lb (79.4 kg)   BMI 25.11 kg/m   Vitals and nursing note reviewed  General: NAD, pleasant, able to participate in exam Left knee: -L Knee: No deformities. No tenderness to palpation along join tline and along patella. Strength 5/5 in all Kayleanna Lorman. FROM. Patellar grind test negative. Patella with no TTP. . Stable to varus and valgus stress. Anterior and posterior drawers negative. Lachman's negative. McMurray and Thessaly negative.  Extremities: no edema or cyanosis. WWP. Skin: warm and dry, no rashes noted Neuro: alert and oriented x4, no focal deficits  Excellent quad strength and no  atrophy Excellent hip abductor strength   Assessment & Plan:    Tear of meniscus of left knee Patient presents today with significant improvement of left knee pain. Patient is essentially pain free without any acute complaints for the past two weeks. Conservative measures appear to have helped. Left knee exam was within normal today, unable to provoke any pain or tenderness. Based on today's exam and patient fairly quick resolution of symptoms, diagnosis would be more consistent with cartilage/meniscus contusion than meniscal tear. Recommend to gradually resume running and biking exercise. Continue with quad and hip pain abductor strengthening 2-3 times a week. Compression sleeve as needed with joint swelling. Follow up in clinic as needed.     Lovena NeighboursAbdoulaye Diallo, MD [facility] PGY-2  I observed and examined the patient with the resident and agree with assessment and plan.  Note reviewed and modified by me. Enid BaasKarl Vir Whetstine, MD

## 2017-08-15 NOTE — Assessment & Plan Note (Addendum)
Patient presents today with significant improvement of left knee pain. Patient is essentially pain free without any acute complaints for the past two weeks. Conservative measures appear to have helped. Left knee exam was within normal today, unable to provoke any pain or tenderness. Based on today's exam and patient fairly quick resolution of symptoms, diagnosis would be more consistent with cartilage/meniscus contusion than meniscal tear. Recommend to gradually resume running and biking exercise. Continue with quad and hip pain abductor strengthening 2-3 times a week. Compression sleeve as needed with joint swelling. Follow up in clinic as needed.

## 2017-10-16 ENCOUNTER — Encounter: Payer: Self-pay | Admitting: Sports Medicine

## 2017-10-16 ENCOUNTER — Ambulatory Visit (INDEPENDENT_AMBULATORY_CARE_PROVIDER_SITE_OTHER): Payer: Federal, State, Local not specified - PPO | Admitting: Sports Medicine

## 2017-10-16 VITALS — BP 120/80 | Ht 69.0 in | Wt 180.0 lb

## 2017-10-16 DIAGNOSIS — M2142 Flat foot [pes planus] (acquired), left foot: Secondary | ICD-10-CM

## 2017-10-16 DIAGNOSIS — M2141 Flat foot [pes planus] (acquired), right foot: Secondary | ICD-10-CM | POA: Diagnosis not present

## 2017-10-17 ENCOUNTER — Encounter: Payer: Self-pay | Admitting: Sports Medicine

## 2017-10-17 NOTE — Progress Notes (Signed)
   Subjective:    Patient ID: Matthew Jackson, male    DOB: 09/28/1980, 37 y.o.   MRN: [id number]  HPIchief complaint: Bilateral foot pain  Very pleasant 37 year old runner comes in today requesting new custom orthotics. His last set of orthotics were made in 2017. They have worked great up until recently. He's now beginning to experience some returning pain. He also has a history of right knee osteoarthritis but despite this he is still able to run 3 miles at a time. He denies any new injury.  Interim medical history reviewed Medications reviewed Allergies reviewed    Review of Systems As above    Objective:   Physical Exam  Well developed, well-nourished. No acute distress. Awake alert and oriented 3. Vital signs reviewed.  Examination of his feet in the standing position shows significant pes planus. No soft tissue swelling. Pronation with walking. Neurovascularly intact distally.      Assessment & Plan:   Pes planus History of right knee osteoarthritis  New orthotics were constructed today. Patient found them to be comfortable prior to leaving the office. His gait was neutral with walking and running. Total of 30 minutes was spent with the patient with greater than 50% of the time spent in face-to-face consultation discussing his orthotic construction, instruction, and fitting. Patient will follow-up as needed.  Patient was fitted for a : standard, cushioned, semi-rigid orthotic. The orthotic was heated and afterward the patient stood on the orthotic blank positioned on the orthotic stand. The patient was positioned in subtalar neutral position and 10 degrees of ankle dorsiflexion in a weight bearing stance. After completion of molding, a stable base was applied to the orthotic blank. The blank was ground to a stable position for weight bearing. Size: 11 Base: Blue EVA Posting: none Additional orthotic padding: none

## 2017-10-23 DIAGNOSIS — F909 Attention-deficit hyperactivity disorder, unspecified type: Secondary | ICD-10-CM | POA: Diagnosis not present

## 2018-01-08 DIAGNOSIS — K080 Exfoliation of teeth due to systemic causes: Secondary | ICD-10-CM | POA: Diagnosis not present

## 2018-01-22 DIAGNOSIS — F909 Attention-deficit hyperactivity disorder, unspecified type: Secondary | ICD-10-CM | POA: Diagnosis not present

## 2018-01-23 DIAGNOSIS — K080 Exfoliation of teeth due to systemic causes: Secondary | ICD-10-CM | POA: Diagnosis not present

## 2018-02-06 DIAGNOSIS — K080 Exfoliation of teeth due to systemic causes: Secondary | ICD-10-CM | POA: Diagnosis not present

## 2018-02-27 DIAGNOSIS — M549 Dorsalgia, unspecified: Secondary | ICD-10-CM | POA: Diagnosis not present

## 2018-02-27 DIAGNOSIS — Z23 Encounter for immunization: Secondary | ICD-10-CM | POA: Diagnosis not present

## 2018-02-27 DIAGNOSIS — Z113 Encounter for screening for infections with a predominantly sexual mode of transmission: Secondary | ICD-10-CM | POA: Diagnosis not present

## 2018-02-27 DIAGNOSIS — Z0000 Encounter for general adult medical examination without abnormal findings: Secondary | ICD-10-CM | POA: Diagnosis not present

## 2018-02-27 DIAGNOSIS — E7800 Pure hypercholesterolemia, unspecified: Secondary | ICD-10-CM | POA: Diagnosis not present

## 2018-04-24 DIAGNOSIS — F909 Attention-deficit hyperactivity disorder, unspecified type: Secondary | ICD-10-CM | POA: Diagnosis not present

## 2018-06-13 DIAGNOSIS — M79671 Pain in right foot: Secondary | ICD-10-CM | POA: Diagnosis not present

## 2018-07-17 DIAGNOSIS — F909 Attention-deficit hyperactivity disorder, unspecified type: Secondary | ICD-10-CM | POA: Diagnosis not present

## 2018-10-09 DIAGNOSIS — F909 Attention-deficit hyperactivity disorder, unspecified type: Secondary | ICD-10-CM | POA: Diagnosis not present

## 2019-01-01 DIAGNOSIS — F909 Attention-deficit hyperactivity disorder, unspecified type: Secondary | ICD-10-CM | POA: Diagnosis not present

## 2019-01-08 DIAGNOSIS — R233 Spontaneous ecchymoses: Secondary | ICD-10-CM | POA: Diagnosis not present

## 2019-03-05 DIAGNOSIS — Z113 Encounter for screening for infections with a predominantly sexual mode of transmission: Secondary | ICD-10-CM | POA: Diagnosis not present

## 2019-03-05 DIAGNOSIS — Z0000 Encounter for general adult medical examination without abnormal findings: Secondary | ICD-10-CM | POA: Diagnosis not present

## 2019-03-05 DIAGNOSIS — F411 Generalized anxiety disorder: Secondary | ICD-10-CM | POA: Diagnosis not present

## 2019-03-05 DIAGNOSIS — Z13220 Encounter for screening for lipoid disorders: Secondary | ICD-10-CM | POA: Diagnosis not present

## 2019-03-05 DIAGNOSIS — Z23 Encounter for immunization: Secondary | ICD-10-CM | POA: Diagnosis not present

## 2019-03-26 DIAGNOSIS — F909 Attention-deficit hyperactivity disorder, unspecified type: Secondary | ICD-10-CM | POA: Diagnosis not present

## 2019-05-06 DIAGNOSIS — Z23 Encounter for immunization: Secondary | ICD-10-CM | POA: Diagnosis not present

## 2019-06-25 DIAGNOSIS — F909 Attention-deficit hyperactivity disorder, unspecified type: Secondary | ICD-10-CM | POA: Diagnosis not present

## 2019-07-22 ENCOUNTER — Other Ambulatory Visit: Payer: Self-pay

## 2019-07-22 ENCOUNTER — Encounter: Payer: Self-pay | Admitting: Family Medicine

## 2019-07-22 ENCOUNTER — Ambulatory Visit: Payer: Self-pay

## 2019-07-22 ENCOUNTER — Ambulatory Visit: Payer: Federal, State, Local not specified - PPO | Admitting: Family Medicine

## 2019-07-22 VITALS — BP 146/78 | Ht 69.0 in | Wt 170.0 lb

## 2019-07-22 DIAGNOSIS — M25562 Pain in left knee: Secondary | ICD-10-CM

## 2019-07-22 NOTE — Progress Notes (Signed)
PCP: Donald Prose, MD  Subjective:   HPI: Patient is a 39 y.o. male here for left knee pain.  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 Patient had immediate pain but was able to continue refereeing the match. He remained sore for 5 more days with pain in left quad and medial left knee. Left quad pain resolved after 5 days but left knee soreness has continued until today. Pain is mild, 2/10, and has not limited activity in any way. Patient is still able to walk, jog, cycle as much as pre-injury. However soreness returns after exercise and is 6/10. Patient describes pain as a burning sensation that is located just medial and superior to the patella. He has used ibuprofen/Naproxen as needed with relief. Patient has a history of meniscal surgery in right knee with subsequent arthritis however pain is of a different quality.   No past medical history on file.  Current Outpatient Medications on File Prior to Visit  Medication Sig Dispense Refill  . AFLURIA PRESERVATIVE FREE 0.5 ML SUSY   0  . amphetamine-dextroamphetamine (ADDERALL) 5 MG tablet Take by mouth.    . clonazePAM (KLONOPIN) 1 MG tablet Take 1 mg by mouth as needed.     No current facility-administered medications on file prior to visit.    No past surgical history on file.  No Known Allergies  Social History   Socioeconomic History  . Marital status: Single    Spouse name: Not on file  . Number of children: Not on file  . Years of education: Not on file  . Highest education level: Not on file  Occupational History  . Not on file  Tobacco Use  . Smoking status: Former Smoker    Start date: 05/17/2007  . Smokeless tobacco: Never Used  Substance and Sexual Activity  . Alcohol use: Yes    Alcohol/week: 0.0 standard drinks  . Drug use: Not on file  . Sexual activity: Not on file  Other Topics Concern  . Not on file  Social History Narrative  . Not on file    Social Determinants of Health   Financial Resource Strain:   . Difficulty of Paying Living Expenses: Not on file  Food Insecurity:   . Worried About Charity fundraiser in the Last Year: Not on file  . Ran Out of Food in the Last Year: Not on file  Transportation Needs:   . Lack of Transportation (Medical): Not on file  . Lack of Transportation (Non-Medical): Not on file  Physical Activity:   . Days of Exercise per Week: Not on file  . Minutes of Exercise per Session: Not on file  Stress:   . Feeling of Stress : Not on file  Social Connections:   . Frequency of Communication with Friends and Family: Not on file  . Frequency of Social Gatherings with Friends and Family: Not on file  . Attends Religious Services: Not on file  . Active Member of Clubs or Organizations: Not on file  . Attends Archivist Meetings: Not on file  . Marital Status: Not on file  Intimate Partner Violence:   . Fear of Current or Ex-Partner: Not on file  . Emotionally Abused: Not on file  . Physically Abused: Not on file  . Sexually Abused: Not on file    No family history on file.  BP (!) 146/78   Ht 5\' 9"  (1.753  m)   Wt 170 lb (77.1 kg)   BMI 25.10 kg/m   Review of Systems: See HPI above.     Objective:  Physical Exam:  General: Alert and oriented x3, NAD, comfortable in exam room HEENT: normocephalic and atraumatic, EOMI, conjunctivae and sclerae clear, MMM, no erythema or exudates, trachea midline,  Left knee Inspection: mild effusion appreciated, no scarring, no overlying bruising or breaks in the skin, no obvious bony abnormalities Palpation: mildly TTP in area medial and superior to patella, able to reproduce burning pain with just scratching the skin, no pain at medial joint line/lateral joint line,bursae, no patellar pain ROM (active): normal ROM in all directions and strength Special Tests: strong endpoints on testing of all ligaments, negative McMurray's and Thessaly's,  normal patellar tracking  Right Knee Normal contralateral knee exam  [place] of Left Knee -intact MCL -intact PCL -menisci appear intact -mild effusion appreciated -non-ruptured Baker's cyst found on [place]  Assessment & Plan:   RAY GERVASI is a 39 y.o. 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 Initial thigh pain resolved after 5 days however medial knee soreness/burning continues. Most likely patient sustained quad contusion that has healed with a grade 1 MCL sprain that has also healed and patient is experiencing lingering neuropathic symptoms. Expect symptoms to continue resolving over next 3-5 weeks without additional intervention. Patient has no acitivty limitation and may continue exercising as tolerated. NSAIDS 2x/day for pain are recommended as well as home PT exercises for 6 weeks. F/u in 1 month if pain does not resolve.   Hilario Quarry MS4

## 2019-07-22 NOTE — Patient Instructions (Signed)
Your exam and ultrasound are reassuring.  I suspect you sustained a quad contusion and Grade 1 MCL sprain but these have healed. You have some residual neuropathic pain and posterior patellar contusion pain that should resolve over the next 3-5 weeks. Aleve/naproxen twice a day with food for pain and inflammation. Icing 15 minutes at a time 3-4 times a day. Activities and exercise as tolerated. Quad strengthening exercises including VMO rehab for next 6 weeks even if your pain resolves. Follow up with me in about 1 month or as needed if you're doing well.

## 2019-09-03 DIAGNOSIS — Z23 Encounter for immunization: Secondary | ICD-10-CM | POA: Diagnosis not present

## 2019-09-24 DIAGNOSIS — F909 Attention-deficit hyperactivity disorder, unspecified type: Secondary | ICD-10-CM | POA: Diagnosis not present

## 2019-12-02 DIAGNOSIS — K080 Exfoliation of teeth due to systemic causes: Secondary | ICD-10-CM | POA: Diagnosis not present

## 2019-12-24 DIAGNOSIS — F909 Attention-deficit hyperactivity disorder, unspecified type: Secondary | ICD-10-CM | POA: Diagnosis not present

## 2020-03-09 DIAGNOSIS — Z13220 Encounter for screening for lipoid disorders: Secondary | ICD-10-CM | POA: Diagnosis not present

## 2020-03-09 DIAGNOSIS — Z113 Encounter for screening for infections with a predominantly sexual mode of transmission: Secondary | ICD-10-CM | POA: Diagnosis not present

## 2020-03-09 DIAGNOSIS — Z23 Encounter for immunization: Secondary | ICD-10-CM | POA: Diagnosis not present

## 2020-03-09 DIAGNOSIS — Z0000 Encounter for general adult medical examination without abnormal findings: Secondary | ICD-10-CM | POA: Diagnosis not present

## 2020-03-16 DIAGNOSIS — F909 Attention-deficit hyperactivity disorder, unspecified type: Secondary | ICD-10-CM | POA: Diagnosis not present

## 2020-04-27 DIAGNOSIS — Z113 Encounter for screening for infections with a predominantly sexual mode of transmission: Secondary | ICD-10-CM | POA: Diagnosis not present

## 2020-05-01 DIAGNOSIS — R768 Other specified abnormal immunological findings in serum: Secondary | ICD-10-CM | POA: Diagnosis not present

## 2020-06-08 DIAGNOSIS — F909 Attention-deficit hyperactivity disorder, unspecified type: Secondary | ICD-10-CM | POA: Diagnosis not present

## 2020-06-12 DIAGNOSIS — R768 Other specified abnormal immunological findings in serum: Secondary | ICD-10-CM | POA: Diagnosis not present

## 2020-11-24 DIAGNOSIS — F909 Attention-deficit hyperactivity disorder, unspecified type: Secondary | ICD-10-CM | POA: Diagnosis not present

## 2021-01-20 ENCOUNTER — Ambulatory Visit: Payer: Managed Care, Other (non HMO) | Admitting: Sports Medicine

## 2021-01-20 ENCOUNTER — Ambulatory Visit: Payer: Self-pay

## 2021-01-20 ENCOUNTER — Other Ambulatory Visit: Payer: Self-pay

## 2021-01-20 VITALS — Ht 70.0 in | Wt 175.0 lb

## 2021-01-20 DIAGNOSIS — M25562 Pain in left knee: Secondary | ICD-10-CM

## 2021-01-20 MED ORDER — MELOXICAM 15 MG PO TABS: 15.0000 mg | ORAL_TABLET | Freq: Every day | ORAL | 0 refills | Status: DC

## 2021-01-20 NOTE — Progress Notes (Signed)
PCP: Deatra James, MD  Subjective:   HPI: Patient is a pleasant 40 y.o. male here for left knee pain x 2 weeks.  Matthew Jackson reports that about 2 weeks ago he was playing football and he was backpedaling when he went to turn with his left foot planted in the ground and as he twisted he felt a pain in the knee.  He had to stop briefly for a few seconds, but then was able to shake off the pain and continued to play the rest of the day.  He had some pain the next few days, however his pain did not increase and his swelling did not present in the left knee until about 1 week after the injury.  Over the last week he has had swelling throughout the anterior aspect of his knee that has extended down into the calf and into the ankle.  His pain is located over the lateral aspect of the knee near the joint line. The pain is exacerbated by walking or with activity.  He denies any mechanical symptoms of locking/catching or giving out of the leg.  He has been taking ibuprofen and has tried icing the knee with some relief.  He does note some swelling of the knee, but denies any redness, or increased warmth to the area.  He denies any prior injury to the left knee, although reports a history of meniscus injury to the right knee with arthroscopy in the past many years ago.   Ht 5\' 10"  (1.778 m)   Wt 175 lb (79.4 kg)   BMI 25.11 kg/m   [facility] 01/20/2021  Frequency of aerobic exercise (# of days/week) 5  Average time in minutes 60  Frequency of strengthening activities (# of days/week) 5    No flowsheet data found.      Objective:  Physical Exam:  Gen: Well-appearing, in no acute distress; non-toxic CV: Regular Rate. Well-perfused. Warm.  Resp: Breathing unlabored on room air; no wheezing. Psych: Fluid speech in conversation; appropriate affect; normal thought process Neuro: Sensation intact throughout. No gross coordination deficits.  MSK:  Knee, left: There is mild TTP over  the lateral joint line; no TTP over the medial joint line or superior or inferior patellar pole.  There is edema of the knee and the lower leg into the ankle, 1+ effusion.  There is no obvious bony abnormality.  No warmth of the knee.  There is some knee crepitus with extension of the knee.  There is a slight fullness in the posterior medial aspect of the knee.  Range of motion is full in all directions, although some pain with resisted extension.  There is slight increased laxity to varus stress at 30 degrees but with firm endpoint, however no laxity at 0 degrees.  Strength 5/5 with knee flexion and extension.  Neurovascular intact distally.  Sensation to light touch intact throughout.  Calf is nontender and soft.   Provocative Testing:    - Patella:   - Patellar grind/compression: NEG   - Patellar glide: Appropriate medial/lateral glide without apprehension - Cruciate Ligaments:   - Anterior Drawer/Lachman test: NEG - Posterior Drawer: NEG  - Collateral Ligaments:   - Varus/Valgus (MCL/LCL) Stress test at 0: Negative; Mild laxity to varus stress at 30 degrees but with firm endpoint   - Apley's Compression/Distraction test: NEG  - Meniscus:   - Thessaly: + Mildly positive on lateral jt line    - McMurray's: + pain of lateral joint  line but w/o palpable/audible click  - PLC: Negative dial's test  MSK Limited knee ultrasound performed, left knee  - Suprapatellar pouch visualized in long and short axis with trace effusion. - Quadriceps tendon visualized in both long and short axis without abnormality. - Patellar Tendon is visualized in long and short axis without abnormality. - Medial meniscus and MCL visualized with no abnormality. - Lateral joint line evaluated with effusion, small cortical irregularity noted of joint line, and hyperemia. Meniscus difficult to fully evaluate 2/2 to swelling.  - Popliteal fossa was visualized, + moderate sized medial baker's cyst identified   IMPRESSION:  Mild-Mod effusion within the knee with hyperemia and cortical irregularity over lateral joint line. Baker's cyst noted within medial popliteal fossa.    Assessment & Plan:  1. Left lateral knee pain x 2 weeks - pain after twisting injury of the knee. Associated effusion and soft tissue swelling of the knee about 1-week following injury. U/S demonstrates mild effusion with Baker's cyst; lateral joint line edema and hyperemia. 1+ laxity of varus stress. Etiology most indicative of lateral meniscus pathology, consider concominant grade 1 LCL sprain as well.   - Mobic 15mg  once daily with food x 10 days, then as needed - Ice, PRICE therapy, and activity modification x 2 weeks - will f/u in about 2 weeks to re-evaluate, if pain and swelling still persist at that time may consider CS injection - in the setting of twisting MOI, effusion, would evaluate with MRI if persists > 6 weeks or he develops any mechanical symptoms of locking/catching/etc.  , DO PGY-4, Sports Medicine Fellow [facility]  Addendum:  Patient seen in the office by fellow.  His history, exam, plan of care were precepted with me.  [facility] MD Norton Blizzard

## 2021-02-09 ENCOUNTER — Ambulatory Visit: Payer: Managed Care, Other (non HMO) | Admitting: Sports Medicine

## 2021-02-09 DIAGNOSIS — M7122 Synovial cyst of popliteal space [Baker], left knee: Secondary | ICD-10-CM | POA: Diagnosis not present

## 2021-02-09 DIAGNOSIS — S8992XA Unspecified injury of left lower leg, initial encounter: Secondary | ICD-10-CM | POA: Insufficient documentation

## 2021-02-09 DIAGNOSIS — S8992XD Unspecified injury of left lower leg, subsequent encounter: Secondary | ICD-10-CM | POA: Diagnosis not present

## 2021-02-09 NOTE — Assessment & Plan Note (Signed)
Patient presents for follow up after twisting left lateral knee injury. US findings continue to show signs of edema concerning for possibly healing left lateral meniscus injury.  Patient counseled to continue to avoid jogging and running for another 4 weeks  Patient to wear compression sleeves on knee and calf to help reduce swelling  Will complete course of Meloxicam  Patietn given exercises for calf raises and counseled to use recumbent bike while avoiding running  F/u in 1 month

## 2021-02-09 NOTE — Progress Notes (Signed)
   PCP: Deatra James, MD  Subjective:   HPI: Patient is a 40 y.o. male here for follow up for left lateral knee pain.  Patient reports he continues to have improvement in lateral left knee.  He reports being able to run about 100 yards without pain as compared to when he first injured his knee.  He states that he has noticed drastic improvement in the last week.  Patient reports decreased swelling.  He denies any pain in the posterior aspect of his knee.  Patient reports he still has 1 week of meloxicam left.      Objective:  Physical Exam: Ht 5\' 10"  (1.778 m)   Wt 175 lb (79.4 kg)   BMI 25.11 kg/m  [facility] 01/20/2021 02/09/2021  Frequency of aerobic exercise (# of days/week) 5 5  Average time in minutes 60 60  Frequency of strengthening activities (# of days/week) 5 5    Gen: awake, alert, NAD, comfortable in exam room Pulm: breathing unlabored  Knee: - Inspection: no gross deformity. + swelling/effusion, no erythema nor bruising. Skin intact, edematous left calf muscle - Palpation: minimal TTP in area of lateral meniscus on left  - ROM: full active ROM with flexion and extension in knee Gets some pain post 140 deg flexion on left - Strength: 5/5 strength - Neuro/vasc: NV intact - Special Tests: - LIGAMENTS: negative anterior and posterior drawer, negative Lachman's, no MCL or LCL laxity  -- MENISCUS: negative McMurray's -- PF JOINT: nml patellar mobility bilaterally.  negative patellar grind, negative patellar apprehension  02/11/2021: Left knee Small effusion in SPP 4 cm cross section baker's cyst visualized again, appears to be ~7-8cm in length, contains debris,  scan also notable for lateral meniscus edema, calcifications, small spur and irregularity of meniscal tissue Medial meniscus is not remarkable Quad and Patellar tendons intact  Impression - lateral meniscus with probable degenerative component, knee effusion and large Baker's Cyst  vs.gastrocnemius bursitis  Ultrasound and interpretation by Er. Simmons-Robinson and Karl B. Fields, MD    Assessment & Plan:    Baker's cyst of knee, left Sizable cyst noted on [place] Counseled patient to wear compression sleeves for knee and left calf muscle  F/u in 1 month   Left knee injury Patient presents for follow up after twisting left lateral knee injury. US findings continue to show signs of edema concerning for possibly healing left lateral meniscus injury.  Patient counseled to continue to avoid jogging and running for another 4 weeks  Patient to wear compression sleeves on knee and calf to help reduce swelling  Will complete course of Meloxicam  Patietn given exercises for calf raises and counseled to use recumbent bike while avoiding running  F/u in 1 month    No orders of the defined types were placed in this encounter.   No orders of the defined types were placed in this encounter.   [place], MD [facility], PGY-3 02/09/2021 10:42 AM I observed and examined the patient with the resident and agree with assessment and plan.  Note reviewed and modified by me. 02/11/2021, MD

## 2021-02-09 NOTE — Assessment & Plan Note (Signed)
Sizable cyst noted on [place] Counseled patient to wear compression sleeves for knee and left calf muscle  F/u in 1 month

## 2021-02-16 ENCOUNTER — Other Ambulatory Visit: Payer: Self-pay | Admitting: Sports Medicine

## 2021-02-16 DIAGNOSIS — M25562 Pain in left knee: Secondary | ICD-10-CM

## 2021-03-16 ENCOUNTER — Ambulatory Visit: Payer: Managed Care, Other (non HMO) | Admitting: Sports Medicine

## 2021-03-16 ENCOUNTER — Other Ambulatory Visit: Payer: Self-pay

## 2021-03-16 DIAGNOSIS — S83282D Other tear of lateral meniscus, current injury, left knee, subsequent encounter: Secondary | ICD-10-CM | POA: Diagnosis not present

## 2021-03-16 NOTE — Assessment & Plan Note (Signed)
He would like to stick with conservative care Will keep up home rehab Modify for any unstable or painful activity Reck 2 mos

## 2021-03-16 NOTE — Progress Notes (Signed)
PCP: Deatra James, MD  Subjective:   HPI: Patient is a 40 y.o. male here for follow up of L lateral knee pain.  Patient states that his pain is generally about the same since his last visit. He has been able to walk more and use the recumbent bike in the mornings. He sometimes has pain and soreness after these activities, but sometimes does not. Pain is located on the lateral aspect of the knee and does not radiate into the calf. He notes that his swelling has gone down. He continues to take Meloxicam once daily for the pain. 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 He does endorse some occasional instability, but no popping or clicking. This morning he did slip on his R foot and stabilized himself with his left leg and continues to notice that "something is going on in this knee."  No past medical history on file.  Current Outpatient Medications on File Prior to Visit  Medication Sig Dispense Refill   AFLURIA PRESERVATIVE FREE 0.5 ML SUSY   0   amphetamine-dextroamphetamine (ADDERALL) 5 MG tablet Take by mouth.     clonazePAM (KLONOPIN) 1 MG tablet Take 1 mg by mouth as needed.     meloxicam (MOBIC) 15 MG tablet TAKE 1 TABLET (15 MG TOTAL) BY MOUTH DAILY. 30 tablet 0   No current facility-administered medications on file prior to visit.    No past surgical history on file.  No Known Allergies  BP (!) 162/94   Ht 5\' 9"  (1.753 m)   Wt 180 lb (81.6 kg)   BMI 26.58 kg/m   [facility] 01/20/2021 02/09/2021 03/16/2021  Frequency of aerobic exercise (# of days/week) 5 5 5   Average time in minutes 60 60 60  Frequency of strengthening activities (# of days/week) 5 5 5     No flowsheet data found.      Objective:  Physical Exam:  Gen: NAD, comfortable in exam room CV: Regular Rate. Well-perfused. Warm.  Resp: Breathing unlabored on room air; no  wheezing. Psych: Fluid speech in conversation; appropriate affect; normal thought process Neuro: Sensation intact throughout. No gross coordination deficits.  MSK:  - L knee: Minimal 1+ edema to the lateral aspect of the knee, no TTP along medial or lateral joint lines, full ROM in flexion and extension 4/5 strength compared to R in resisted knee extension, increased lateral patellar glide compared to R patella, + patellar grind test, - patellar apprehension test, mild increased laxity on varus tress at 30 degrees, no laxity at 0 degrees  Ultrasound of Left knee Small effusion in SPP PT and QT intact Lateral meniscus shows fragmentation and smaller pseudomeniscal cyst Small spurring at lat. JT line Medical meniscus unremarkable Baker's cyst now 3 x 2 cm ( was 4x7)  Impression: lateal meniscal tear - probably degenerative Small effusion Moderate Baker's cyst  Ultrasound and interpretation by 13/05/2020. Lyonel Morejon, MD    Assessment & Plan:  1. Lateral meniscus tear   - Continue conservative management: walking, recumbent bike, Meloxicam prn, compression sleeve on knee prn  - Avoid jogging at this time given instability of meniscus with loading - Return in 2 months for re-assessment   2. Baker's cyst - Decreased in size based on today with improvement of calf swelling - Not causing any significant pain or imitation of function -  Return in 2 months for re-assessment  Orland Jarred, DO Family Medicine, PGY-3  I observed and examined the patient with the resident and agree with assessment and plan.  Note reviewed and modified by me. Sterling Big, MD

## 2021-03-23 ENCOUNTER — Other Ambulatory Visit: Payer: Self-pay | Admitting: Sports Medicine

## 2021-03-23 DIAGNOSIS — M25562 Pain in left knee: Secondary | ICD-10-CM

## 2021-04-19 ENCOUNTER — Other Ambulatory Visit: Payer: Self-pay | Admitting: Sports Medicine

## 2021-04-19 DIAGNOSIS — M25562 Pain in left knee: Secondary | ICD-10-CM

## 2021-05-18 ENCOUNTER — Ambulatory Visit: Payer: Managed Care, Other (non HMO) | Admitting: Sports Medicine

## 2021-05-20 ENCOUNTER — Ambulatory Visit: Payer: Managed Care, Other (non HMO) | Admitting: Sports Medicine

## 2021-05-20 VITALS — BP 128/84 | Ht 70.0 in | Wt 180.0 lb

## 2021-05-20 DIAGNOSIS — S83282D Other tear of lateral meniscus, current injury, left knee, subsequent encounter: Secondary | ICD-10-CM | POA: Diagnosis not present

## 2021-05-20 DIAGNOSIS — M7122 Synovial cyst of popliteal space [Baker], left knee: Secondary | ICD-10-CM

## 2021-05-20 NOTE — Assessment & Plan Note (Signed)
Overall having some provement and most likely would benefit more from getting back to running and decreasing his weight than to continue to rest.  We will have him progress back to his normal running, he usually runs about 5 days a week, 3 miles a day.  We will start him back at 1.5 miles every other day for 1 week.  He will increase by 0.5 miles weekly as tolerated.  We will have him stop at 3 miles every other day.  Use a compression sleeve.  After about 4 to 6 weeks if he is able to progress without significant worsening of symptoms, we will have him return and will likely increase him back to his usual frequency.  He can bike on his off days.

## 2021-05-20 NOTE — Assessment & Plan Note (Signed)
Has Baker's cyst is asymptomatic, would not currently do anything more with this at this time.

## 2021-05-20 NOTE — Progress Notes (Signed)
Matthew Jackson is a 41 y.o. male who presents to [facility] today for the following:  Left lateral meniscus tear Last seen for the same on 11/1 Had serial meniscal cyst on ultrasound in September, then on repeat in November had improvement in size Baker's cyst was also smaller than previously noted Has been doing conservative management including recumbent bike instead of jogging Has not been running since he was last seen and has not been happy about this States he has gained about 10 pounds because he has not been running States that he still feels some slight instability while going down the stairs and feels some crunching in the anterior aspect of his knee when he bends down Also has some occasional lateral pain when he is crossing his legs   PMH reviewed.  ROS as above. Medications reviewed.  Exam:  BP 128/84    Ht 5\' 10"  (1.778 m)    Wt 180 lb (81.6 kg)    BMI 25.83 kg/m  Gen: Well NAD MSK:  Left knee: - Inspection: no gross deformity b/l. No swelling/effusion, erythema or bruising b/l. Skin intact - Palpation: no TTP b/l, there is some slight crepitus palpated with range of motion on the right - ROM: full active ROM with flexion and extension in knee and hip b/l - Strength: 5/5 strength b/l - Neuro/vasc: NV intact distally b/l - Special Tests: - LIGAMENTS: negative anterior and posterior drawer, negative Lachman's, no MCL or LCL laxity  -- MENISCUS: negative McMurray's, positive Thessaly  -- PF JOINT: negative patellar apprehension  Hips: normal ROM  Limited MSK ultrasound left knee: Images were obtained both in the transverse and longitudinal plane. Patellar and quadriceps tendons were well visualized with no abnormalities. Minimal effusion. Medial and lateral menisci were medial meniscus visualized without abnormality.  Lateral meniscus shows improvement in the parameniscal cyst, there is a small hyperechoic calcification noted at the inferior aspect of the lateral  meniscus.   There is an obvious Baker's cyst that measures about 3.8 cm x 1.6 cm on transverse axis.  On longitudinal axis it is larger and over 5 cm in length.  In comparison to prior ultrasound, it does appear slightly smaller. Trochlear view with some calcifications noted under the patella but preserved cartilage space.  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  There is also some calcification under the patella which could be consistent with his crunching sensation.  Ultrasound and interpretation by . Fields, MD and Sibyl Parr, DO   No results found.   Assessment and Plan: 1) Tear of lateral meniscus of left knee, current Overall having some provement and most likely would benefit more from getting back to running and decreasing his weight than to continue to rest.  We will have him progress back to his normal running, he usually runs about 5 days a week, 3 miles a day.  We will start him back at 1.5 miles every other day for 1 week.  He will increase by 0.5 miles weekly as tolerated.  We will have him stop at 3 miles every other day.  Use a compression sleeve.  After about 4 to 6 weeks if he is able to progress without significant worsening of symptoms, we will have him return and will likely increase him back to his usual frequency.  He can bike on his off days.  Baker's cyst of knee, left Has Baker's cyst is asymptomatic, would not currently do anything more with  this at this time.   Luis Abed, D.O.  PGY-4 [facility]  05/20/2021 5:04 PM  I observed and examined the patient with the [facility] resident and agree with assessment and plan.  Note reviewed and modified by me. Sterling Big, MD

## 2021-05-20 NOTE — Patient Instructions (Signed)
Thank you for coming to see me today. It was a pleasure. Today we talked about:   We will have you progress back to running.  Started at 1.5 miles every other day for a week If you do well, progress every week by 0.5 miles until you reach 3 miles If you are having significant pain do not increase your mileage and do not continue. You can bike on your off days. Use a compression sleeve while you run.  Please follow-up with [place] in 6 weeks.  If you have any questions or concerns, please do not hesitate to call the office at [phone].  Best,   Luis Abed, DO [facility]

## 2021-08-24 ENCOUNTER — Ambulatory Visit: Payer: Managed Care, Other (non HMO) | Admitting: Sports Medicine

## 2021-12-29 DIAGNOSIS — R55 Syncope and collapse: Secondary | ICD-10-CM | POA: Diagnosis not present

## 2022-05-18 DIAGNOSIS — F411 Generalized anxiety disorder: Secondary | ICD-10-CM | POA: Diagnosis not present

## 2022-06-01 DIAGNOSIS — F411 Generalized anxiety disorder: Secondary | ICD-10-CM | POA: Diagnosis not present

## 2022-06-22 DIAGNOSIS — F411 Generalized anxiety disorder: Secondary | ICD-10-CM | POA: Diagnosis not present

## 2022-07-18 DIAGNOSIS — F909 Attention-deficit hyperactivity disorder, unspecified type: Secondary | ICD-10-CM | POA: Diagnosis not present

## 2022-07-20 DIAGNOSIS — F411 Generalized anxiety disorder: Secondary | ICD-10-CM | POA: Diagnosis not present

## 2022-07-29 DIAGNOSIS — M545 Low back pain, unspecified: Secondary | ICD-10-CM | POA: Diagnosis not present

## 2022-08-09 DIAGNOSIS — K644 Residual hemorrhoidal skin tags: Secondary | ICD-10-CM | POA: Diagnosis not present

## 2022-08-10 DIAGNOSIS — F411 Generalized anxiety disorder: Secondary | ICD-10-CM | POA: Diagnosis not present

## 2022-08-31 DIAGNOSIS — K644 Residual hemorrhoidal skin tags: Secondary | ICD-10-CM | POA: Diagnosis not present

## 2022-09-07 DIAGNOSIS — F411 Generalized anxiety disorder: Secondary | ICD-10-CM | POA: Diagnosis not present

## 2022-10-05 DIAGNOSIS — F411 Generalized anxiety disorder: Secondary | ICD-10-CM | POA: Diagnosis not present

## 2022-10-12 DIAGNOSIS — F909 Attention-deficit hyperactivity disorder, unspecified type: Secondary | ICD-10-CM | POA: Diagnosis not present

## 2022-11-01 DIAGNOSIS — F411 Generalized anxiety disorder: Secondary | ICD-10-CM | POA: Diagnosis not present

## 2022-11-07 DIAGNOSIS — K644 Residual hemorrhoidal skin tags: Secondary | ICD-10-CM | POA: Diagnosis not present

## 2022-11-23 DIAGNOSIS — F411 Generalized anxiety disorder: Secondary | ICD-10-CM | POA: Diagnosis not present

## 2022-12-01 DIAGNOSIS — F411 Generalized anxiety disorder: Secondary | ICD-10-CM | POA: Diagnosis not present

## 2022-12-14 DIAGNOSIS — F411 Generalized anxiety disorder: Secondary | ICD-10-CM | POA: Diagnosis not present

## 2022-12-26 DIAGNOSIS — K644 Residual hemorrhoidal skin tags: Secondary | ICD-10-CM | POA: Diagnosis not present

## 2022-12-29 DIAGNOSIS — F411 Generalized anxiety disorder: Secondary | ICD-10-CM | POA: Diagnosis not present

## 2023-01-03 DIAGNOSIS — F909 Attention-deficit hyperactivity disorder, unspecified type: Secondary | ICD-10-CM | POA: Diagnosis not present

## 2023-01-25 DIAGNOSIS — F411 Generalized anxiety disorder: Secondary | ICD-10-CM | POA: Diagnosis not present

## 2023-02-22 DIAGNOSIS — F411 Generalized anxiety disorder: Secondary | ICD-10-CM | POA: Diagnosis not present

## 2023-02-24 DIAGNOSIS — M25512 Pain in left shoulder: Secondary | ICD-10-CM | POA: Diagnosis not present

## 2023-02-24 DIAGNOSIS — Z23 Encounter for immunization: Secondary | ICD-10-CM | POA: Diagnosis not present

## 2023-03-28 DIAGNOSIS — F909 Attention-deficit hyperactivity disorder, unspecified type: Secondary | ICD-10-CM | POA: Diagnosis not present

## 2023-03-29 DIAGNOSIS — F411 Generalized anxiety disorder: Secondary | ICD-10-CM | POA: Diagnosis not present

## 2023-05-03 DIAGNOSIS — F411 Generalized anxiety disorder: Secondary | ICD-10-CM | POA: Diagnosis not present

## 2023-05-25 DIAGNOSIS — F411 Generalized anxiety disorder: Secondary | ICD-10-CM | POA: Diagnosis not present

## 2023-05-26 DIAGNOSIS — R768 Other specified abnormal immunological findings in serum: Secondary | ICD-10-CM | POA: Diagnosis not present

## 2023-05-26 DIAGNOSIS — Z13220 Encounter for screening for lipoid disorders: Secondary | ICD-10-CM | POA: Diagnosis not present

## 2023-05-26 DIAGNOSIS — Z23 Encounter for immunization: Secondary | ICD-10-CM | POA: Diagnosis not present

## 2023-05-26 DIAGNOSIS — Z113 Encounter for screening for infections with a predominantly sexual mode of transmission: Secondary | ICD-10-CM | POA: Diagnosis not present

## 2023-05-26 DIAGNOSIS — Z0000 Encounter for general adult medical examination without abnormal findings: Secondary | ICD-10-CM | POA: Diagnosis not present

## 2023-06-19 DIAGNOSIS — F909 Attention-deficit hyperactivity disorder, unspecified type: Secondary | ICD-10-CM | POA: Diagnosis not present

## 2023-09-19 DIAGNOSIS — F909 Attention-deficit hyperactivity disorder, unspecified type: Secondary | ICD-10-CM | POA: Diagnosis not present

## 2023-10-04 DIAGNOSIS — R059 Cough, unspecified: Secondary | ICD-10-CM | POA: Diagnosis not present

## 2023-10-04 DIAGNOSIS — R0981 Nasal congestion: Secondary | ICD-10-CM | POA: Diagnosis not present

## 2023-10-04 DIAGNOSIS — J029 Acute pharyngitis, unspecified: Secondary | ICD-10-CM | POA: Diagnosis not present

## 2023-10-17 DIAGNOSIS — F411 Generalized anxiety disorder: Secondary | ICD-10-CM | POA: Diagnosis not present

## 2023-10-30 DIAGNOSIS — K648 Other hemorrhoids: Secondary | ICD-10-CM | POA: Diagnosis not present

## 2023-12-05 DIAGNOSIS — F411 Generalized anxiety disorder: Secondary | ICD-10-CM | POA: Diagnosis not present

## 2023-12-12 DIAGNOSIS — F909 Attention-deficit hyperactivity disorder, unspecified type: Secondary | ICD-10-CM | POA: Diagnosis not present

## 2024-03-06 DIAGNOSIS — F909 Attention-deficit hyperactivity disorder, unspecified type: Secondary | ICD-10-CM | POA: Diagnosis not present

## 2024-04-23 DIAGNOSIS — F411 Generalized anxiety disorder: Secondary | ICD-10-CM | POA: Diagnosis not present
